# Patient Record
Sex: Female | Born: 1960 | Race: Black or African American | Hispanic: No | Marital: Married | Smoking: Current every day smoker
Health system: Southern US, Community
[De-identification: ages and names within clinical notes are randomized; demographics above are authoritative.]

## PROBLEM LIST (undated history)

## (undated) ENCOUNTER — Ambulatory Visit: Discharge status: Absent without leave | Payer: Self-pay

## (undated) DIAGNOSIS — E7800 Pure hypercholesterolemia, unspecified: Secondary | ICD-10-CM

## (undated) DIAGNOSIS — I10 Essential (primary) hypertension: Secondary | ICD-10-CM

## (undated) DIAGNOSIS — H409 Unspecified glaucoma: Secondary | ICD-10-CM

## (undated) DIAGNOSIS — H5440 Blindness, one eye, unspecified eye: Secondary | ICD-10-CM

## (undated) HISTORY — DX: Unspecified glaucoma: H40.9

## (undated) HISTORY — PX: FERTILITY SURGERY: SHX945

## (undated) HISTORY — PX: BACK SURGERY: SHX140

## (undated) HISTORY — DX: Essential (primary) hypertension: I10

## (undated) HISTORY — DX: Blindness, one eye, unspecified eye: H54.40

## (undated) HISTORY — DX: Pure hypercholesterolemia, unspecified: E78.00

---

## 2019-08-05 ENCOUNTER — Emergency Department (HOSPITAL_BASED_OUTPATIENT_CLINIC_OR_DEPARTMENT_OTHER): Payer: Self-pay

## 2019-08-05 ENCOUNTER — Other Ambulatory Visit: Payer: Self-pay

## 2019-08-05 ENCOUNTER — Encounter (HOSPITAL_BASED_OUTPATIENT_CLINIC_OR_DEPARTMENT_OTHER): Payer: Self-pay

## 2019-08-05 ENCOUNTER — Emergency Department (HOSPITAL_BASED_OUTPATIENT_CLINIC_OR_DEPARTMENT_OTHER)
Admission: EM | Admit: 2019-08-05 | Discharge: 2019-08-05 | Disposition: A | Discharge status: Home / Self Care | Payer: Self-pay | Attending: Emergency Medicine | Admitting: Emergency Medicine

## 2019-08-05 DIAGNOSIS — R519 Headache, unspecified: Secondary | ICD-10-CM | POA: Insufficient documentation

## 2019-08-05 DIAGNOSIS — I10 Essential (primary) hypertension: Secondary | ICD-10-CM | POA: Insufficient documentation

## 2019-08-05 DIAGNOSIS — F17210 Nicotine dependence, cigarettes, uncomplicated: Secondary | ICD-10-CM | POA: Insufficient documentation

## 2019-08-05 DIAGNOSIS — H5711 Ocular pain, right eye: Secondary | ICD-10-CM | POA: Insufficient documentation

## 2019-08-05 DIAGNOSIS — H409 Unspecified glaucoma: Secondary | ICD-10-CM | POA: Insufficient documentation

## 2019-08-05 MED ORDER — FLUORESCEIN SODIUM 1 MG OP STRP
1.0000 | ORAL_STRIP | Freq: Once | OPHTHALMIC | Status: AC
  Administered 2019-08-05: 1 via OPHTHALMIC
  Filled 2019-08-05: qty 1

## 2019-08-05 MED ORDER — ACETAZOLAMIDE 250 MG PO TABS
250.0000 mg | ORAL_TABLET | Freq: Once | ORAL | Status: AC
  Administered 2019-08-05: 250 mg via ORAL
  Filled 2019-08-05: qty 1

## 2019-08-05 MED ORDER — ACETAZOLAMIDE 250 MG PO TABS: 250.0000 mg | ORAL_TABLET | Freq: Two times a day (BID) | ORAL | 0 refills | Status: AC

## 2019-08-05 MED ORDER — TETRACAINE HCL 0.5 % OP SOLN
2.0000 [drp] | Freq: Once | OPHTHALMIC | Status: AC
  Administered 2019-08-05: 2 [drp] via OPHTHALMIC
  Filled 2019-08-05: qty 4

## 2019-08-05 NOTE — Discharge Instructions (Signed)
Please read and follow all provided instructions.  Your diagnoses today include:  1. Glaucoma of both eyes, unspecified glaucoma type   2. Acute right eye pain     Tests performed today include:  CT of your head which was normal and did not show any serious cause of your headache  Eye pressures: very high in your right eye at 55, 25 on the left  Vital signs. See below for your results today.   Medications:   Diamox - oral medication to help to lower eye pressure  Take any prescribed medications only as directed.  Additional information:  Follow any educational materials contained in this packet.  You are having a headache. No specific cause was found today for your headache. It may have been a migraine or other cause of headache. Stress, anxiety, fatigue, and depression are common triggers for headaches.   Your headache today does not appear to be life-threatening or require hospitalization, but often the exact cause of headaches is not determined in the emergency department. Therefore, follow-up with your doctor is very important to find out what may have caused your headache and whether or not you need any further diagnostic testing or treatment.   Sometimes headaches can appear benign (not harmful), but then more serious symptoms can develop which should prompt an immediate re-evaluation by your doctor or the emergency department.  BE VERY CAREFUL not to take multiple medicines containing Tylenol (also called acetaminophen). Doing so can lead to an overdose which can damage your liver and cause liver failure and possibly death.   Follow-up instructions: You will need to establish care with an eye specialist. You are best served by going to an academic center like [facility]. You may call [phone] to contact [facility] and talk to someone about scheduling an appointment with a doctor there. You may also follow-up with Dr. Wynelle Link, however they may refer you to [facility]    Return instructions:   Please return to the Emergency Department if you experience worsening symptoms.  Return if the medications do not resolve your headache, if it recurs, or if you have multiple episodes of vomiting or cannot keep down fluids.  Return if you have a change from the usual headache.  RETURN IMMEDIATELY IF you:  Develop a sudden, severe headache  Develop confusion or become poorly responsive or faint  Develop a fever above 100.18F or problem breathing  Have a change in speech, vision, swallowing, or understanding  Develop new weakness, numbness, tingling, incoordination in your arms or legs  Have a seizure  Please return if you have any other emergent concerns.  Additional Information:  Your vital signs today were: BP (!) 145/94 (BP Location: Right Arm)    Pulse (!) 59    Temp 97.8 F (36.6 C) (Oral)    Resp 16    Ht 5' 4.5" (1.638 m)    Wt 70.3 kg    SpO2 100%    BMI 26.19 kg/m  If your blood pressure (BP) was elevated above 135/85 this visit, please have this repeated by your doctor within one month. --------------

## 2019-08-05 NOTE — ED Provider Notes (Signed)
[facility] EMERGENCY DEPARTMENT Provider Note   CSN: [id number] Arrival date & time: 08/05/19  1516     History Chief Complaint  Patient presents with  . Eye Problem    Shelby Contreras is a 59 y.o. female.  Patient with history of bilateral glaucoma with right eye blindness, hypercholesterol, hypertension --presents to the emergency department for right-sided eye pain and severe headache gradually worsening over the past 2 days, becoming severe last night with associated vomiting.  Patient has a history of bilateral glaucoma for which she takes atenolol, Simbrinza, latanoprost drops.  She last took this this morning.  She was previously treated by Dr. Barrie Folk in [place], [place].  She states that she has been blind in her right eye for about 5 years.  She has no light perception.  She denies head injury, fevers, confusion.  No weakness, numbness, or tingling in her arms or her legs.  She has never had pain like this before.  No history of blood thinners.        Past Medical History:  Diagnosis Date  . Glaucoma   . High cholesterol   . Hypertension     There are no problems to display for this patient.   Past Surgical History:  Procedure Laterality Date  . BACK SURGERY    . FERTILITY SURGERY       OB History   No obstetric history on file.     No family history on file.  Social History   Tobacco Use  . Smoking status: Current Every Day Smoker  . Smokeless tobacco: Never Used  Vaping Use  . Vaping Use: Never used  Substance Use Topics  . Alcohol use: Never  . Drug use: Not Currently    Comment: crack    Home Medications Prior to Admission medications   Not on File    Allergies    Patient has no known allergies.  Review of Systems   Review of Systems  Constitutional: Negative for fever.  HENT: Negative for rhinorrhea and sore throat.   Eyes: Positive for pain. Negative for redness.  Respiratory: Negative for cough.   Cardiovascular:  Negative for chest pain.  Gastrointestinal: Positive for nausea and vomiting. Negative for abdominal pain and diarrhea.  Genitourinary: Negative for dysuria.  Musculoskeletal: Negative for myalgias and neck pain.  Skin: Negative for rash.  Neurological: Positive for headaches. Negative for weakness and numbness.    Physical Exam Updated Vital Signs BP (!) 140/96 (BP Location: Right Arm)   Pulse (!) 52   Temp 97.8 F (36.6 C) (Oral)   Resp 18   Ht 5' 4.5" (1.638 m)   Wt 70.3 kg   SpO2 100%   BMI 26.19 kg/m   Physical Exam Vitals and nursing note reviewed.  Constitutional:      Appearance: She is well-developed.  HENT:     Head: Normocephalic and atraumatic.  Eyes:     General: Lids are normal.        Right eye: No discharge.        Left eye: No discharge.     Intraocular pressure: Right eye pressure is 55 mmHg. Left eye pressure is 25 mmHg. Measurements were taken using a handheld tonometer.    Extraocular Movements: Extraocular movements intact.     Right eye: Normal extraocular motion.     Left eye: Normal extraocular motion.     Conjunctiva/sclera: Conjunctivae normal.     Right eye: Right conjunctiva is not injected.  Left eye: Left conjunctiva is not injected.     Pupils: Pupils are unequal.     Right eye: Pupil is not reactive. No corneal abrasion or fluorescein uptake.     Left eye: Pupil is reactive. No corneal abrasion or fluorescein uptake.     Comments: Cornea is hazy on right, clear on left. Right globe is firm to palpation.   Cardiovascular:     Rate and Rhythm: Normal rate and regular rhythm.     Heart sounds: Normal heart sounds.  Pulmonary:     Effort: Pulmonary effort is normal.     Breath sounds: Normal breath sounds.  Abdominal:     Palpations: Abdomen is soft.     Tenderness: There is no abdominal tenderness.  Musculoskeletal:     Cervical back: Normal range of motion and neck supple.  Skin:    General: Skin is warm and dry.  Neurological:       Mental Status: She is alert.     ED Results / Procedures / Treatments   Labs (all labs ordered are listed, but only abnormal results are displayed) Labs Reviewed - No data to display  EKG None  Radiology CT Head Wo Contrast  Result Date: 08/05/2019 CLINICAL DATA:  Headache EXAM: CT HEAD WITHOUT CONTRAST TECHNIQUE: Contiguous axial images were obtained from the base of the skull through the vertex without intravenous contrast. COMPARISON:  None. FINDINGS: Brain: No evidence of acute infarction, hemorrhage, hydrocephalus, extra-axial collection or mass lesion/mass effect. Periventricular white matter hypoattenuation likely represents chronic small vessel ischemic disease. Vascular: There are vascular calcifications in the carotid siphons. Skull: Normal. Negative for fracture or focal lesion. Sinuses/Orbits: No acute finding. Other: None. IMPRESSION: No acute intracranial process. Electronically Signed   By: Romona Curls M.D.   On: 08/05/2019 16:49    Procedures Procedures (including critical care time)  Medications Ordered in ED Medications  tetracaine (PONTOCAINE) 0.5 % ophthalmic solution 2 drop (2 drops Right Eye Given by Other 08/05/19 1547)  fluorescein ophthalmic strip 1 strip (1 strip Right Eye Given 08/05/19 1549)  acetaZOLAMIDE (DIAMOX) tablet 250 mg (250 mg Oral Given 08/05/19 1621)    ED Course  I have reviewed the triage vital signs and the nursing notes.  Pertinent labs & imaging results that were available during my care of the patient were reviewed by me and considered in my medical decision making (see chart for details).  Patient seen and examined.  Tonometry and fluorescein exam performed.  External eye exam performed.  Vital signs reviewed and are as follows: BP (!) 140/96 (BP Location: Right Arm)   Pulse (!) 52   Temp 97.8 F (36.6 C) (Oral)   Resp 18   Ht 5' 4.5" (1.638 m)   Wt 70.3 kg   SpO2 100%   BMI 26.19 kg/m     Visual Acuity  Right Eye  Distance:  (PT blind in right eye) Left Eye Distance: 20/25 Bilateral Distance:    Right Eye Near:   Left Eye Near:    Bilateral Near:     4:23 PM patient was discussed with Dr. Charm Barges.   I contacted Dr. Wynelle Link of ophthalmology and discussed the case.  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  Recommended Diamox 250 mg twice daily to help lower the pressure, however there are no other acute recommendations given that patient is blind in this eye.  If she has inflammatory changes, she may require surgical procedure to help lower the pressure.  This is not something that needs to be done immediately.  Recommends follow-up at [facility].   Patient updated.  She agrees to CT imaging to evaluate for other cause of headache.  Currently she has minimal pain.  Clinical Course as of Aug 05 1718  Wynelle Link Aug 05, 2019  1656 She is presenting with right eye pain.  History of blindness in that eye and glaucoma bilaterally.  Never gets pain in the eye and has no trauma.  Pressures are high on the right.  Reviewed with ophthalmology.  Checking head CT to make sure there is not any other explanation for your symptoms.   [MB]    Clinical Course User Index [MB] Terrilee Files, MD   5:20 PM CT negative.  Patient updated.  Offered pain medication, she states that she would prefer to take Aleve at home.  Provided prescription for Diamox.  Provided referral for local ophthalmologist as well as general [facility] number.  Patient encouraged to return with worsening or changing symptoms including persistent vomiting, confusion, severe uncontrolled pain.  She verbalizes understanding and agrees with plan.   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  Pressures in the eye are markedly elevated on the right.  In the 50s now, states baseline  around 40.  Discussed with ophthalmology.  Will temporize with Diamox.  She will need ophthalmologic follow-up and potential surgical procedure if this continues to be a problem.  No indication for admission at this point.  Head CT without other etiology of symptoms.  Patient is neurologically intact.  Very low concern for occult bleeding intracranially.  Patient is not tender over the temporal area and I have low suspicion for temporal arteritis.    Final Clinical Impression(s) / ED Diagnoses Final diagnoses:  Glaucoma of both eyes, unspecified glaucoma type  Acute right eye pain    Rx / DC Orders ED Discharge Orders         Ordered    acetaZOLAMIDE (DIAMOX) 250 MG tablet  2 times daily     Discontinue  Reprint     08/05/19 1715           Renne Crigler, PA-C 08/05/19 1722    Terrilee Files, MD 08/06/19 1141

## 2019-08-05 NOTE — ED Triage Notes (Signed)
Pt reports a pressure in her R eye. Pt has hx of glaucoma.

## 2019-10-20 ENCOUNTER — Other Ambulatory Visit: Payer: Self-pay

## 2019-10-20 ENCOUNTER — Encounter (HOSPITAL_BASED_OUTPATIENT_CLINIC_OR_DEPARTMENT_OTHER): Payer: Self-pay | Admitting: Emergency Medicine

## 2019-10-20 ENCOUNTER — Emergency Department (HOSPITAL_BASED_OUTPATIENT_CLINIC_OR_DEPARTMENT_OTHER)
Admission: EM | Admit: 2019-10-20 | Discharge: 2019-10-20 | Disposition: A | Discharge status: Home / Self Care | Payer: Self-pay | Attending: Emergency Medicine | Admitting: Emergency Medicine

## 2019-10-20 DIAGNOSIS — J111 Influenza due to unidentified influenza virus with other respiratory manifestations: Secondary | ICD-10-CM | POA: Insufficient documentation

## 2019-10-20 DIAGNOSIS — I10 Essential (primary) hypertension: Secondary | ICD-10-CM | POA: Insufficient documentation

## 2019-10-20 DIAGNOSIS — Z20822 Contact with and (suspected) exposure to covid-19: Secondary | ICD-10-CM | POA: Insufficient documentation

## 2019-10-20 DIAGNOSIS — F17210 Nicotine dependence, cigarettes, uncomplicated: Secondary | ICD-10-CM | POA: Insufficient documentation

## 2019-10-20 LAB — SARS CORONAVIRUS 2 BY RT PCR (HOSPITAL ORDER, PERFORMED IN ~~LOC~~ HOSPITAL LAB): SARS Coronavirus 2: NEGATIVE

## 2019-10-20 MED ORDER — GUAIFENESIN ER 600 MG PO TB12
600.0000 mg | ORAL_TABLET | ORAL | Status: DC
  Filled 2019-10-20: qty 1

## 2019-10-20 MED ORDER — LORATADINE 10 MG PO TABS
10.0000 mg | ORAL_TABLET | Freq: Once | ORAL | Status: AC
  Administered 2019-10-20: 10 mg via ORAL
  Filled 2019-10-20: qty 1

## 2019-10-20 NOTE — ED Provider Notes (Signed)
[facility] EMERGENCY DEPARTMENT Provider Note   CSN: [id number] Arrival date & time: 10/20/19  0007     History Chief Complaint  Patient presents with  . flu like symptoms    Shelby Contreras is a 59 y.o. female.  The history is provided by the patient.  URI Presenting symptoms: congestion, rhinorrhea and sore throat   Presenting symptoms: no cough and no fever   Severity:  Mild Onset quality:  Gradual Duration:  6 days Timing:  Constant Progression:  Unchanged Chronicity:  New Relieved by:  Nothing Worsened by:  Nothing Ineffective treatments:  None tried Associated symptoms: no arthralgias, no swollen glands and no wheezing   Risk factors: not elderly   Is covid vaccinated     Past Medical History:  Diagnosis Date  . Blindness of right eye   . Glaucoma   . High cholesterol   . Hypertension     There are no problems to display for this patient.   Past Surgical History:  Procedure Laterality Date  . BACK SURGERY    . FERTILITY SURGERY       OB History   No obstetric history on file.     Family History  Problem Relation Age of Onset  . Hypertension Mother   . Diabetes Father   . Hypertension Father   . Hypertension Sister   . Cancer Sister   . Hypertension Brother     Social History   Tobacco Use  . Smoking status: Current Every Day Smoker    Packs/day: 0.50    Types: Cigarettes  . Smokeless tobacco: Never Used  Vaping Use  . Vaping Use: Never used  Substance Use Topics  . Alcohol use: Never  . Drug use: Not Currently    Comment: crack    Home Medications Prior to Admission medications   Medication Sig Start Date End Date Taking? Authorizing Provider  acetaZOLAMIDE (DIAMOX) 250 MG tablet Take 1 tablet (250 mg total) by mouth 2 (two) times daily. 08/05/19   Renne Crigler, PA-C  Brinzolamide-Brimonidine (SIMBRINZA) 1-0.2 % SUSP Apply 1 drop to eye in the morning, at noon, and at bedtime.    [provider]  latanoprost  (XALATAN) 0.005 % ophthalmic solution Place 1 drop into both eyes at bedtime.    [provider]  timolol (BETIMOL) 0.25 % ophthalmic solution Place 1 drop into both eyes daily.    [provider]    Allergies    Patient has no known allergies.  Review of Systems   Review of Systems  Constitutional: Negative for fever.  HENT: Positive for congestion, rhinorrhea and sore throat.   Eyes: Negative for visual disturbance.  Respiratory: Negative for cough, shortness of breath and wheezing.   Cardiovascular: Negative for chest pain.  Gastrointestinal: Negative for abdominal pain.  Genitourinary: Negative for difficulty urinating.  Musculoskeletal: Negative for arthralgias.  Neurological: Negative for dizziness.  Psychiatric/Behavioral: Negative for agitation.  All other systems reviewed and are negative.   Physical Exam Updated Vital Signs BP 140/86 (BP Location: Left Arm)   Pulse 91   Temp 98.8 F (37.1 C) (Oral)   Resp 18   Ht 5' 4.5" (1.638 m)   Wt 66.2 kg   SpO2 99%   BMI 24.67 kg/m   Physical Exam Vitals and nursing note reviewed.  Constitutional:      General: She is not in acute distress.    Appearance: Normal appearance.  HENT:     Head: Normocephalic and atraumatic.  Nose: Congestion and rhinorrhea present.     Mouth/Throat:     Mouth: Mucous membranes are moist.     Pharynx: Oropharynx is clear.  Eyes:     Conjunctiva/sclera: Conjunctivae normal.     Pupils: Pupils are equal, round, and reactive to light.  Cardiovascular:     Rate and Rhythm: Normal rate and regular rhythm.     Pulses: Normal pulses.     Heart sounds: Normal heart sounds.  Pulmonary:     Effort: Pulmonary effort is normal.     Breath sounds: Normal breath sounds.  Abdominal:     General: Abdomen is flat. Bowel sounds are normal.     Palpations: Abdomen is soft.     Tenderness: There is no abdominal tenderness. There is no guarding or rebound.  Musculoskeletal:         General: Normal range of motion.     Cervical back: Normal range of motion and neck supple.  Skin:    General: Skin is warm and dry.     Capillary Refill: Capillary refill takes less than 2 seconds.  Neurological:     General: No focal deficit present.     Mental Status: She is alert and oriented to person, place, and time.     Deep Tendon Reflexes: Reflexes normal.  Psychiatric:        Mood and Affect: Mood normal.        Behavior: Behavior normal.     ED Results / Procedures / Treatments   Labs (all labs ordered are listed, but only abnormal results are displayed) Labs Reviewed  SARS CORONAVIRUS 2 BY RT PCR (HOSPITAL ORDER, PERFORMED IN [facility])    EKG None  Radiology No results found.  Procedures Procedures (including critical care time)  Medications Ordered in ED Medications  loratadine (CLARITIN) tablet 10 mg (has no administration in time range)  guaiFENesin (MUCINEX) 12 hr tablet 600 mg (has no administration in time range)    ED Course  I have reviewed the triage vital signs and the nursing notes.  Pertinent labs & imaging results that were available during my care of the patient were reviewed by me and considered in my medical decision making (see chart for details).   Viral symptoms.  No signs of PNA.  flonase and zyrtec and quarantine.  The results will be in mychart.    Shelby Contreras was evaluated in Emergency Department on 10/20/2019 for the symptoms described in the history of present illness. She was evaluated in the context of the global COVID-19 pandemic, which necessitated consideration that the patient might be at risk for infection with the SARS-CoV-2 virus that causes COVID-19. Institutional protocols and algorithms that pertain to the evaluation of patients at risk for COVID-19 are in a state of rapid change based on information released by regulatory bodies including the CDC and federal and state organizations. These policies and  algorithms were followed during the patient's care in the ED.  Final Clinical Impression(s) / ED Diagnoses Return for intractable cough, coughing up blood,fevers >100.4 unrelieved by medication, shortness of breath, intractable vomiting, chest pain, shortness of breath, weakness,numbness, changes in speech, facial asymmetry,abdominal pain, passing out,Inability to tolerate liquids or food, cough, altered mental status or any concerns. No signs of systemic illness or infection. The patient is nontoxic-appearing on exam and vital signs are within normal limits.   I have reviewed the triage vital signs and the nursing notes. Pertinent labs &imaging results that were available  during my care of the patient were reviewed by me and considered in my medical decision making (see chart for details).After history, exam, and medical workup I feel the patient has beenappropriately medically screened and is safe for discharge home. Pertinent diagnoses were discussed with the patient. Patient was given return precautions.   Bethany Hirt, MD 10/20/19 8341

## 2019-10-20 NOTE — Discharge Instructions (Addendum)
Take flonase and zyrtec daily

## 2019-10-20 NOTE — ED Triage Notes (Signed)
Pt states she has had flu like symptoms since Monday night  Denies fever or vomiting

## 2021-06-25 IMAGING — CT CT HEAD W/O CM
3 series · 16 of 47 positions shown, 19 images · non-contrast
Comparison: None.

CLINICAL DATA: Headache

EXAM:
CT HEAD WITHOUT CONTRAST
TECHNIQUE: Contiguous axial images were obtained from the base of the skull
through the vertex without intravenous contrast.

[Series 2: head wo · axial · 0.42mm/px · z∈[-167,-42]mm · 10 of 31 slices shown, 13 images]
[im 3/31  brain]
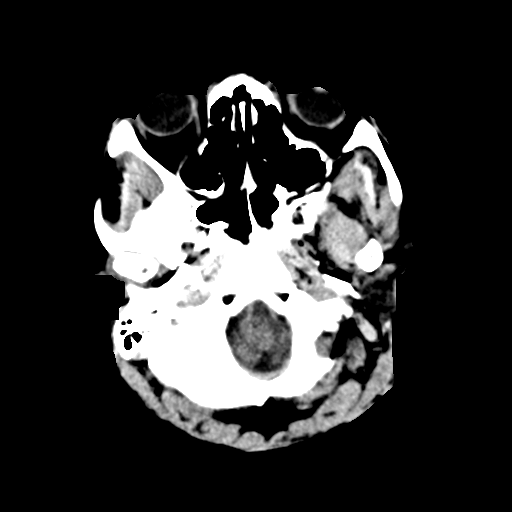
[im 3/31  bone]
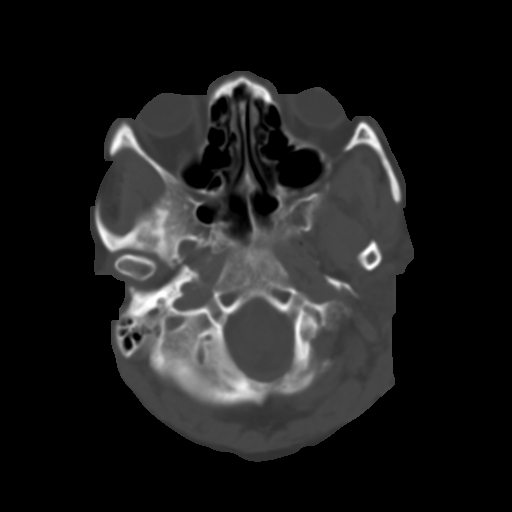
[im 6/31  brain]
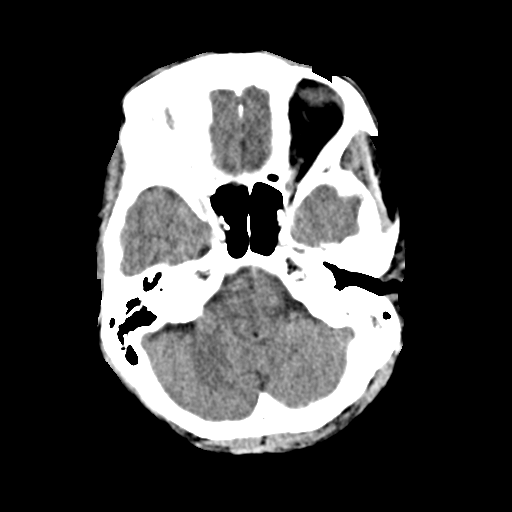
[im 9/31  brain]
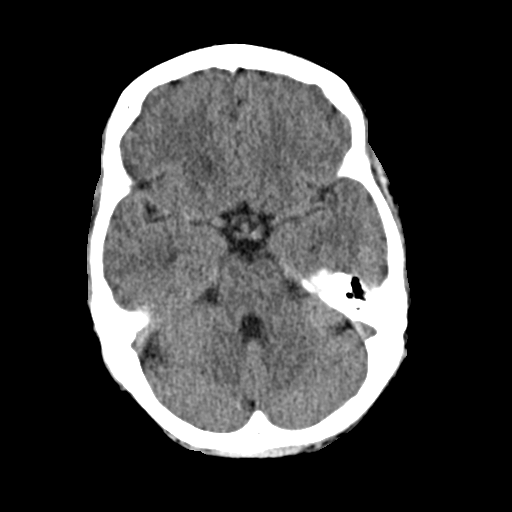
[im 11/31  brain]
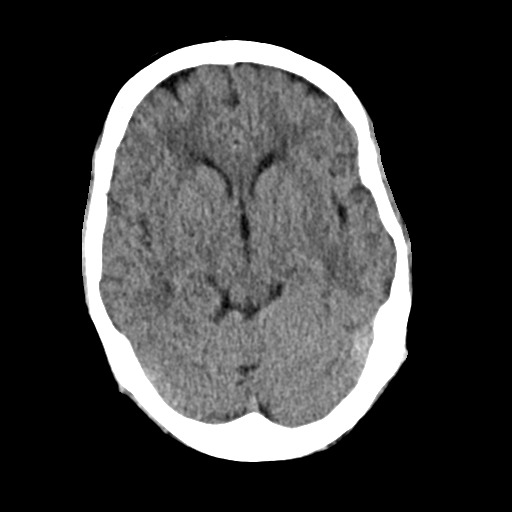
[im 14/31  brain]
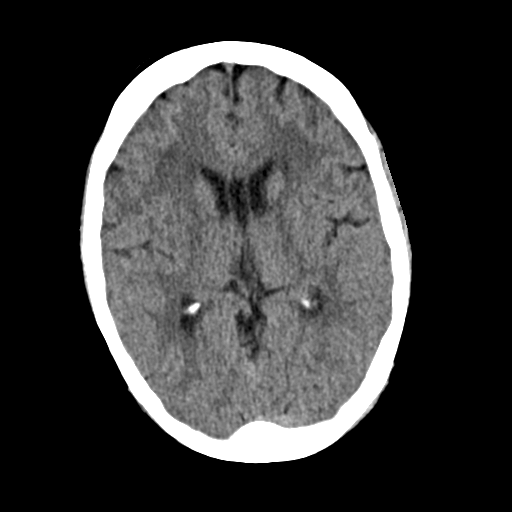
[im 14/31  bone]
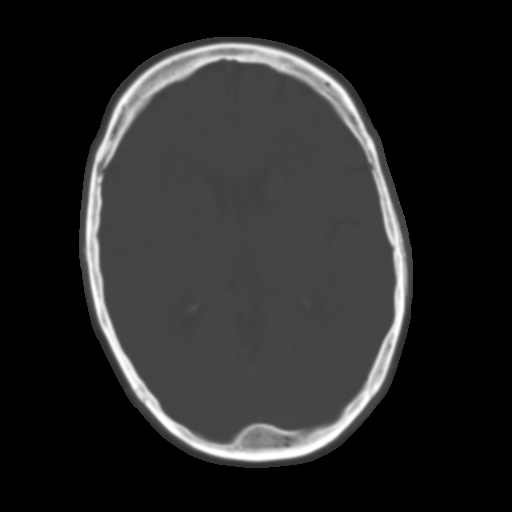
[im 17/31  brain]
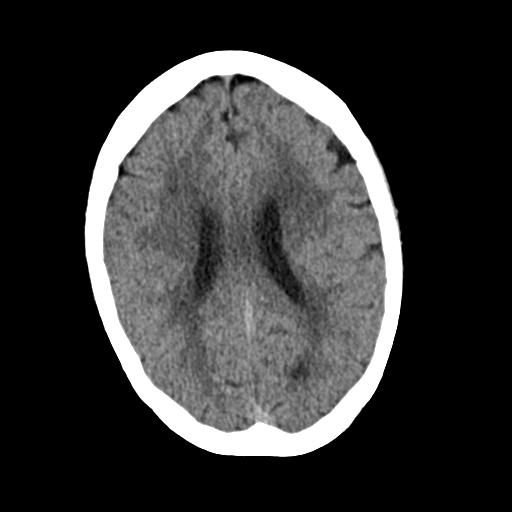
[im 20/31  brain]
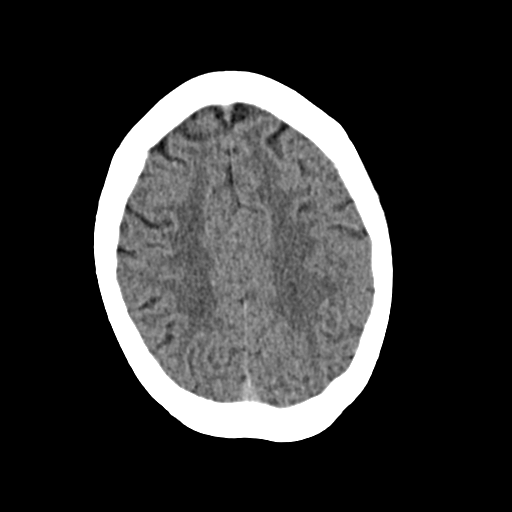
[im 23/31  brain]
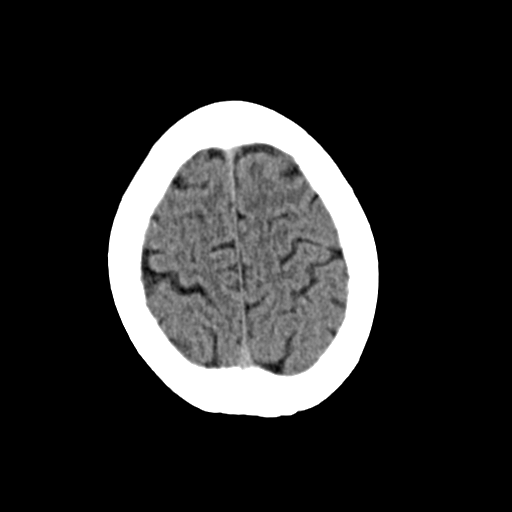
[im 25/31  brain]
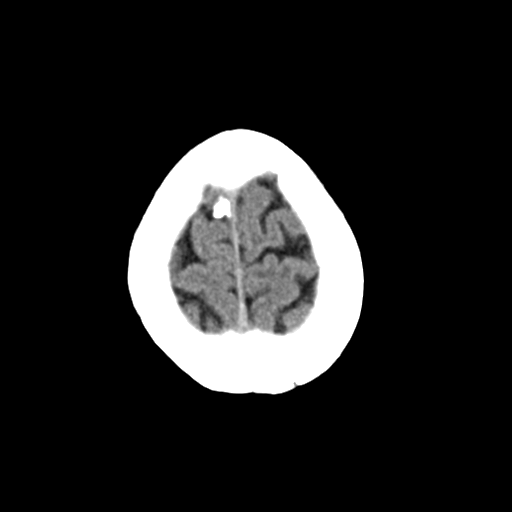
[im 25/31  bone]
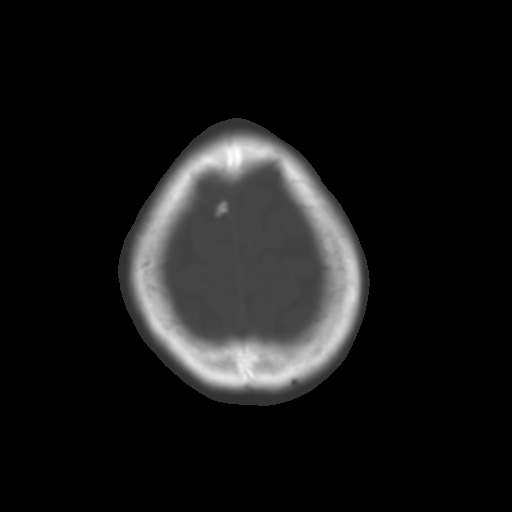
[im 28/31  brain]
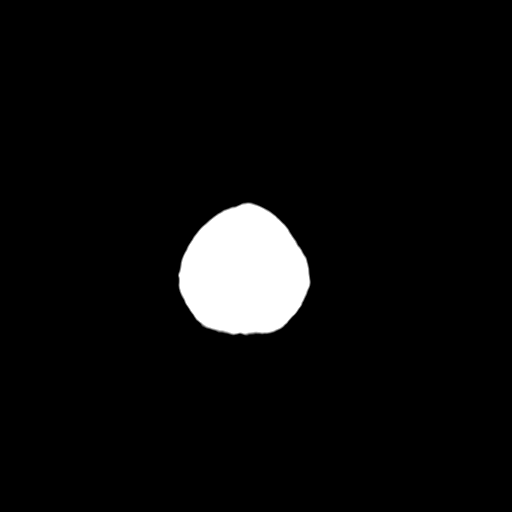

[Series 4: coronal soft · coronal · 0.30mm/px · 3 of 66 slices shown]
[im 22/66  brain]
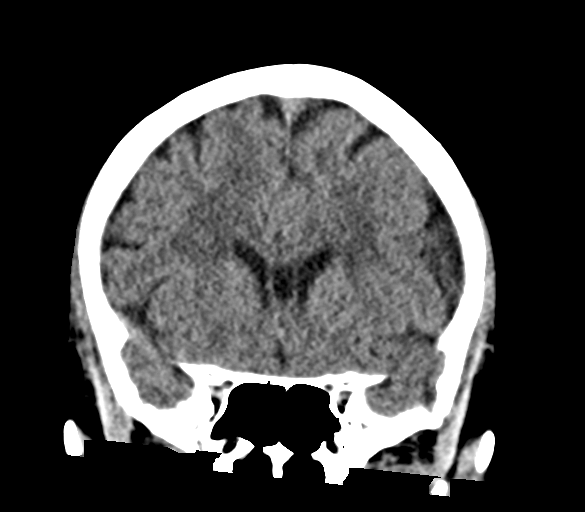
[im 29/66  brain]
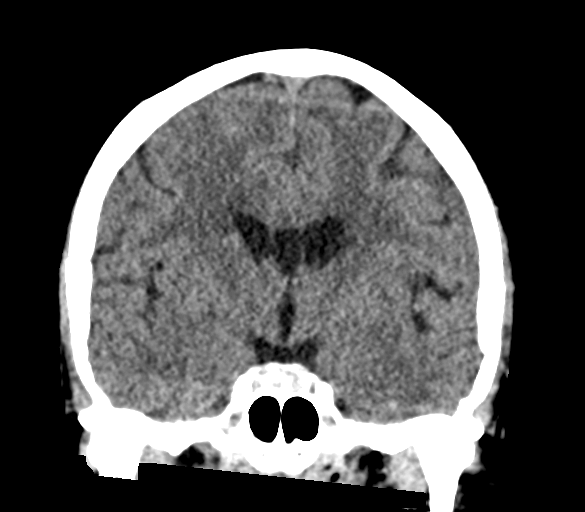
[im 37/66  brain]
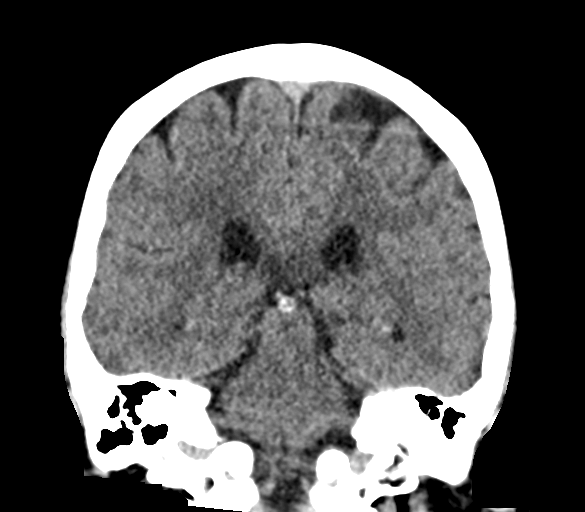

[Series 5: sag soft · sagittal · 0.37mm/px · 3 of 54 slices shown]
[im 18/54  brain]
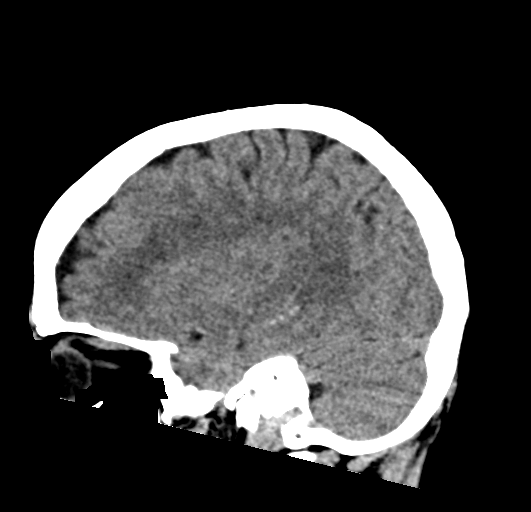
[im 27/54  brain]
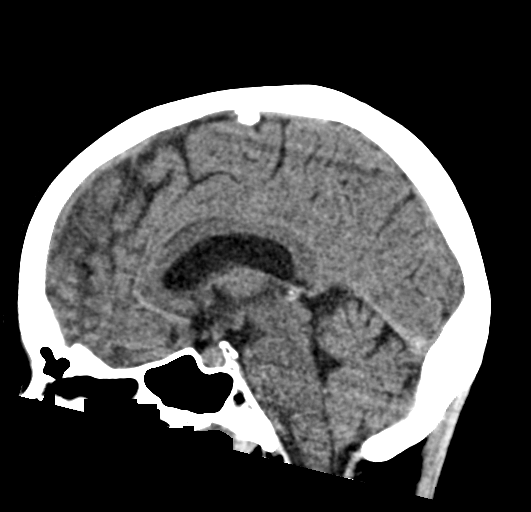
[im 36/54  brain]
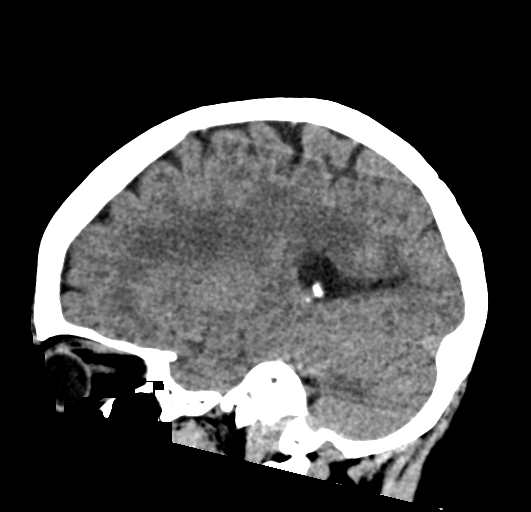

[16 of 47 positions shown; findings below may reference images not displayed]

FINDINGS: Brain: No evidence of acute infarction, hemorrhage, hydrocephalus,
extra-axial collection or mass lesion/mass effect. Periventricular
white matter hypoattenuation likely represents chronic small vessel
ischemic disease.

Vascular: There are vascular calcifications in the carotid siphons.

Skull: Normal. Negative for fracture or focal lesion.

Sinuses/Orbits: No acute finding.

Other: None.
IMPRESSION: No acute intracranial process.
